# Patient Record
Sex: Male | Born: 1995 | Race: White | Marital: Single
Health system: Southern US, Community
[De-identification: ages and names within clinical notes are randomized; demographics above are authoritative.]

---

## 2014-11-13 ENCOUNTER — Other Ambulatory Visit (INDEPENDENT_AMBULATORY_CARE_PROVIDER_SITE_OTHER): Payer: Self-pay | Admitting: General Surgery

## 2014-11-13 DIAGNOSIS — K4090 Unilateral inguinal hernia, without obstruction or gangrene, not specified as recurrent: Secondary | ICD-10-CM

## 2014-11-13 NOTE — Addendum Note (Signed)
Addended by: Ernestene MentionINGRAM, Malesha Suliman M on: 11/13/2014 04:27 PM   Modules accepted: Orders

## 2014-11-14 ENCOUNTER — Other Ambulatory Visit (INDEPENDENT_AMBULATORY_CARE_PROVIDER_SITE_OTHER): Payer: Self-pay | Admitting: *Deleted

## 2014-11-14 DIAGNOSIS — K4090 Unilateral inguinal hernia, without obstruction or gangrene, not specified as recurrent: Secondary | ICD-10-CM

## 2014-11-16 ENCOUNTER — Other Ambulatory Visit: Payer: Self-pay

## 2014-11-20 ENCOUNTER — Ambulatory Visit
Admission: RE | Admit: 2014-11-20 | Discharge: 2014-11-20 | Disposition: A | Discharge status: Home / Self Care | Payer: 59 | Source: Ambulatory Visit | Attending: General Surgery | Admitting: General Surgery

## 2016-06-10 IMAGING — US US PELVIS LIMITED
1 series · 12 of 12 positions shown · non-contrast
Comparison: None.

CLINICAL DATA: Left inguinal pain.

EXAM:
US PELVIS LIMITED
TECHNIQUE: Ultrasound examination of the pelvic soft tissues was performed in
the area of clinical concern in the left inguinal region.

[Series 1: us pelvis limited · 0.06mm/px · 12 of 12 slices shown]
[im 1/12]
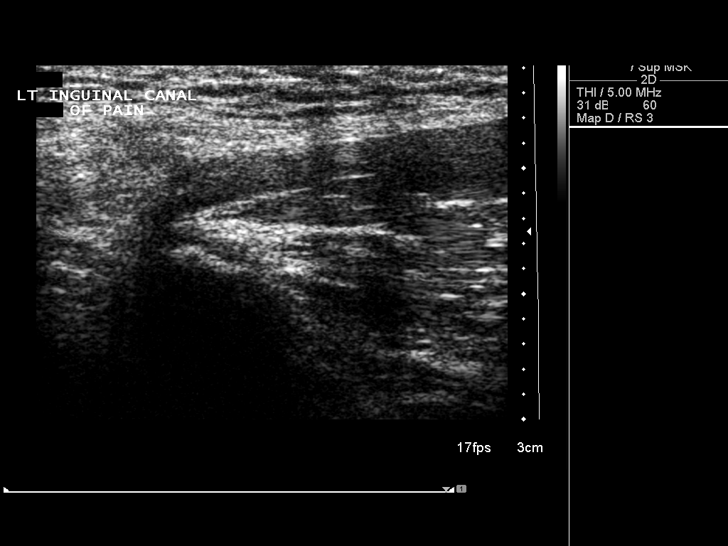
[im 2/12]
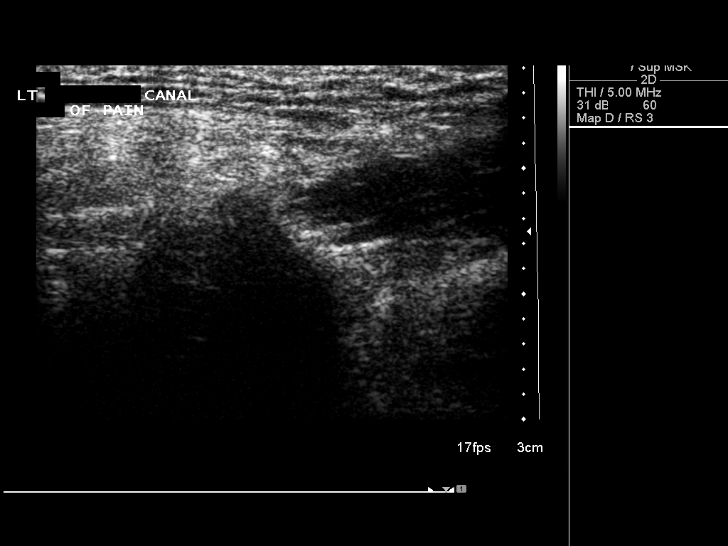
[im 3/12]
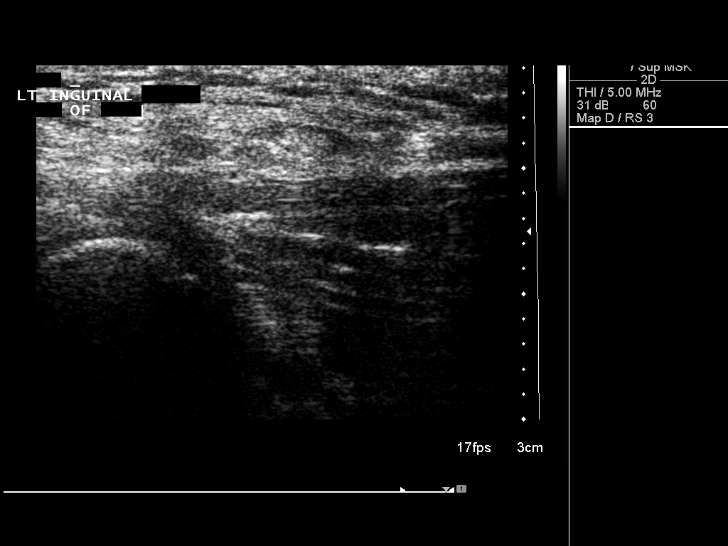
[im 4/12]
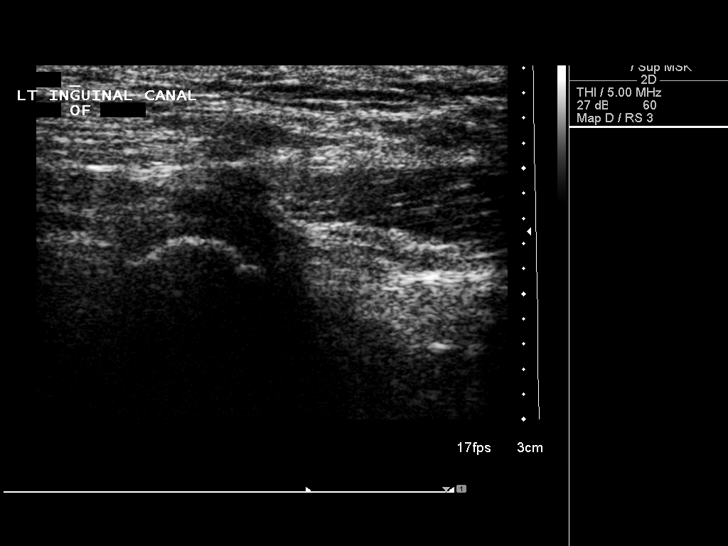
[im 5/12]
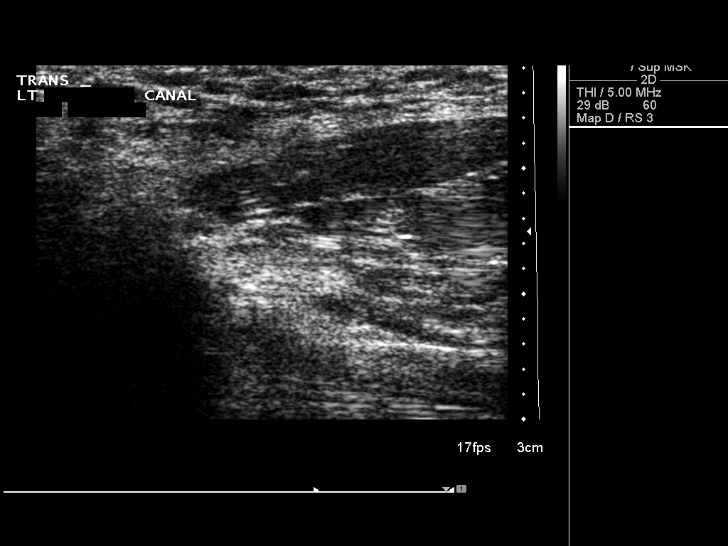
[im 6/12]
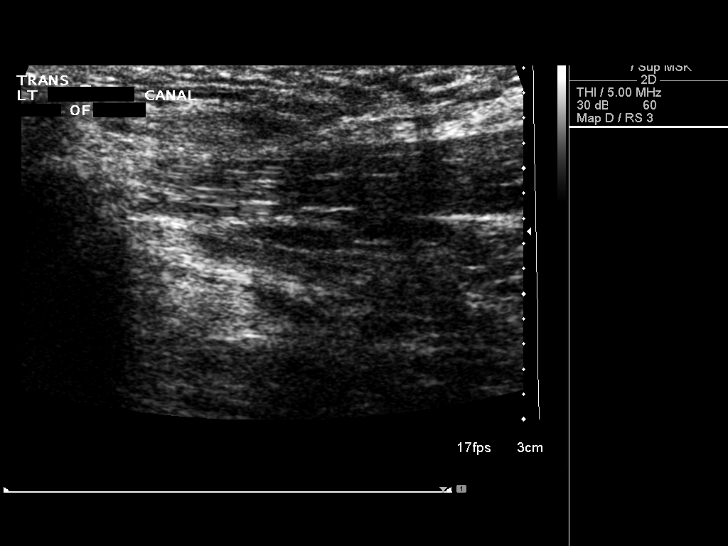
[im 7/12]
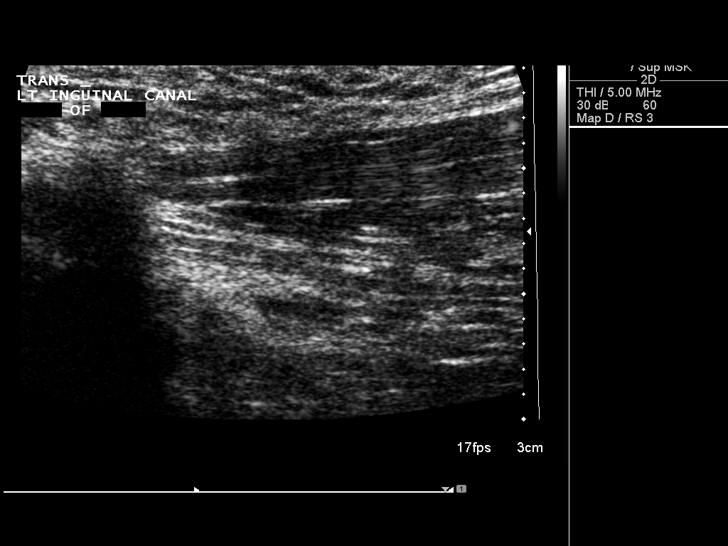
[im 8/12]
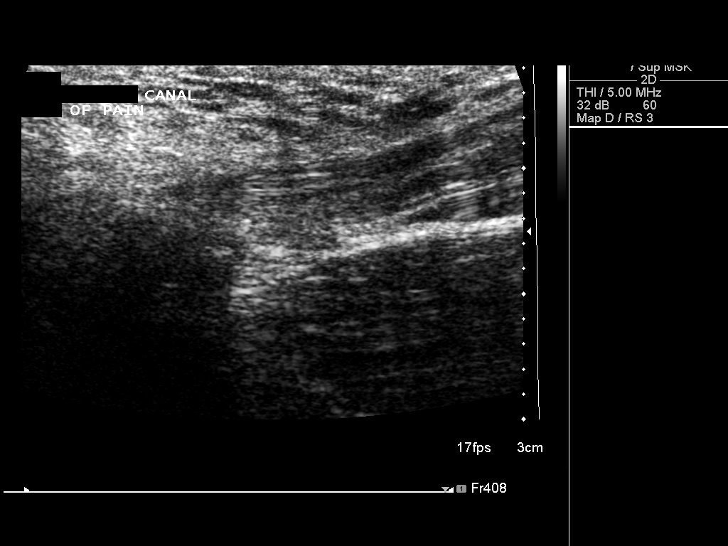
[im 9/12]
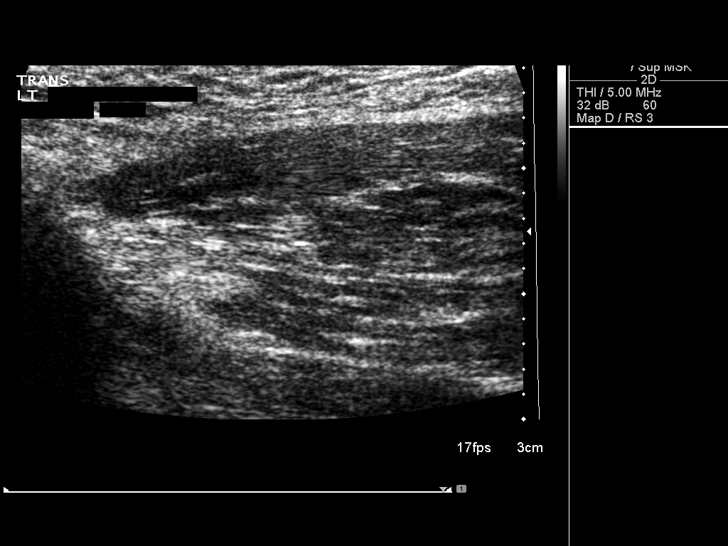
[im 10/12]
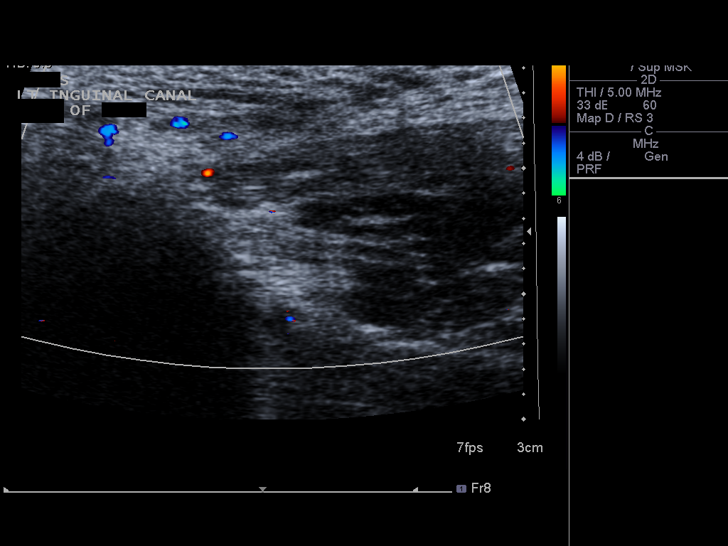
[im 11/12]
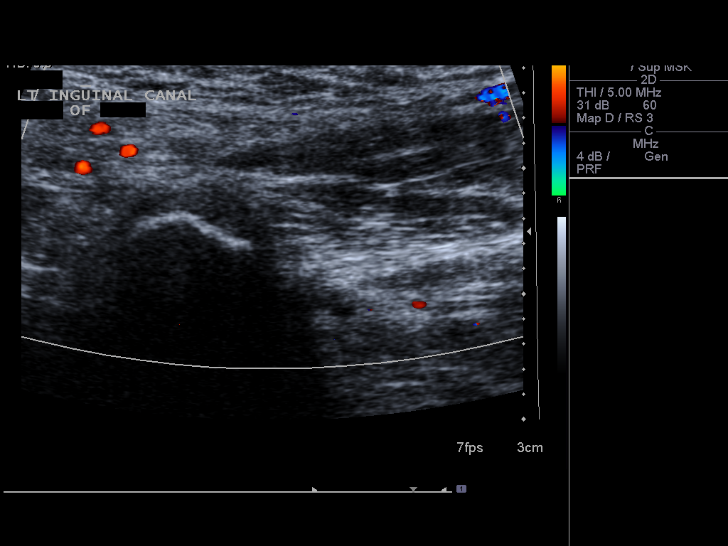
[im 12/12]
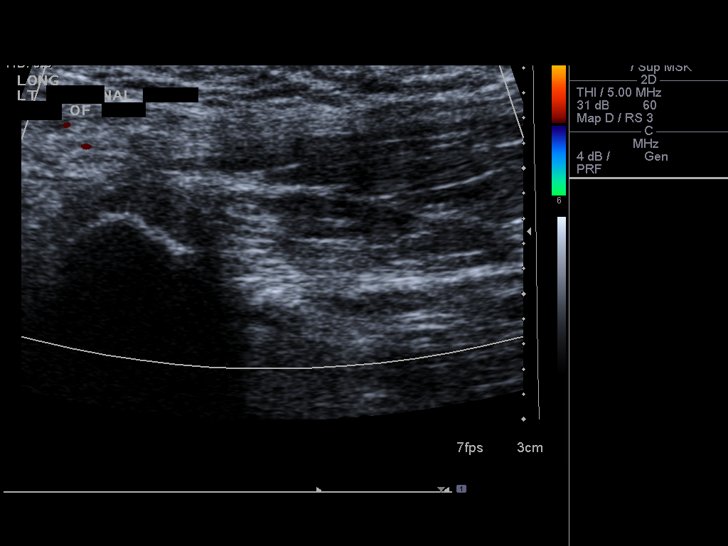

[12 of 12 positions shown; findings below may reference images not displayed]

FINDINGS: No soft tissue mass or fluid collections seen in the left inguinal
region. No definite hernia visualized.
IMPRESSION: No definite hernia or mass identified sonographically in left
inguinal region.
